# Patient Record
Sex: Male | Born: 1955
Health system: Southern US, Community
[De-identification: ages and names within clinical notes are randomized; demographics above are authoritative.]

## PROBLEM LIST (undated history)

## (undated) DIAGNOSIS — K635 Polyp of colon: Secondary | ICD-10-CM

## (undated) DIAGNOSIS — N200 Calculus of kidney: Secondary | ICD-10-CM

## (undated) DIAGNOSIS — M1990 Unspecified osteoarthritis, unspecified site: Secondary | ICD-10-CM

## (undated) HISTORY — PX: KIDNEY STONE SURGERY: SHX686

## (undated) HISTORY — PX: GALLBLADDER SURGERY: SHX652

## (undated) HISTORY — DX: Polyp of colon: K63.5

## (undated) HISTORY — PX: INGUINAL HERNIA REPAIR: SHX194

## (undated) HISTORY — DX: Unspecified osteoarthritis, unspecified site: M19.90

## (undated) HISTORY — DX: Calculus of kidney: N20.0

## (undated) HISTORY — PX: CATARACT EXTRACTION: SUR2

---

## 1998-01-18 ENCOUNTER — Ambulatory Visit (HOSPITAL_COMMUNITY): Admission: RE | Admit: 1998-01-18 | Discharge: 1998-01-18 | Payer: Self-pay | Admitting: Family Medicine

## 1998-01-18 ENCOUNTER — Encounter: Payer: Self-pay | Admitting: Family Medicine

## 1998-01-26 ENCOUNTER — Ambulatory Visit (HOSPITAL_COMMUNITY): Admission: RE | Admit: 1998-01-26 | Discharge: 1998-01-27 | Payer: Self-pay | Admitting: General Surgery

## 2004-07-21 ENCOUNTER — Ambulatory Visit (HOSPITAL_COMMUNITY): Admission: RE | Admit: 2004-07-21 | Discharge: 2004-07-21 | Payer: Self-pay | Admitting: General Surgery

## 2006-08-08 ENCOUNTER — Encounter: Admission: RE | Admit: 2006-08-08 | Discharge: 2006-08-08 | Payer: Self-pay | Admitting: General Surgery

## 2007-03-07 HISTORY — PX: COLONOSCOPY: SHX174

## 2008-06-08 ENCOUNTER — Emergency Department (HOSPITAL_COMMUNITY): Admission: EM | Admit: 2008-06-08 | Discharge: 2008-06-08 | Payer: Self-pay | Admitting: Emergency Medicine
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
## 2010-06-15 LAB — CBC
HCT: 42.1 % (ref 39.0–52.0)
Hemoglobin: 14.2 g/dL (ref 13.0–17.0)
MCHC: 33.7 g/dL (ref 30.0–36.0)
MCV: 90.4 fL (ref 78.0–100.0)
Platelets: 193 10*3/uL (ref 150–400)
RBC: 4.66 MIL/uL (ref 4.22–5.81)
RDW: 13.5 % (ref 11.5–15.5)
WBC: 8.4 10*3/uL (ref 4.0–10.5)

## 2010-06-15 LAB — DIFFERENTIAL
Basophils Absolute: 0 10*3/uL (ref 0.0–0.1)
Eosinophils Absolute: 0 10*3/uL (ref 0.0–0.7)
Lymphocytes Relative: 11 % — ABNORMAL LOW (ref 12–46)
Lymphs Abs: 0.9 10*3/uL (ref 0.7–4.0)
Neutrophils Relative %: 84 % — ABNORMAL HIGH (ref 43–77)

## 2010-06-15 LAB — POCT I-STAT, CHEM 8
Creatinine, Ser: 1.2 mg/dL (ref 0.4–1.5)
Hemoglobin: 15.3 g/dL (ref 13.0–17.0)
Potassium: 3.6 mEq/L (ref 3.5–5.1)
Sodium: 140 mEq/L (ref 135–145)

## 2010-07-22 NOTE — Op Note (Signed)
Raymond Rowe, Raymond Rowe               ACCOUNT NO.:  [id number]   MEDICAL RECORD NO.:  [id number]          PATIENT TYPE:  AMB   LOCATION:  DAY                          FACILITY:  [facility]   PHYSICIAN:  Ollen Gross. Vernell Morgans, M.D. DATE OF BIRTH:  Jul 22, 1955   DATE OF PROCEDURE:  07/21/2004  DATE OF DISCHARGE:                                 OPERATIVE REPORT   PREOPERATIVE DIAGNOSIS:  Right inguinal hernia.   POSTOPERATIVE DIAGNOSIS:  Right direct inguinal hernia.   PROCEDURE:  Right inguinal hernia repair with mesh.   SURGEON:  Dr. Carolynne Edouard   ANESTHESIA:  General endotracheal.   PROCEDURE:  After informed consent was obtained, the patient was brought to  the operating and placed in a supine position on the operating room table.  After induction of general anesthesia, the patient's abdomen and right groin  were prepped with Betadine and draped in usual sterile manner.  The right  groin was then infiltrated with 0.25% Marcaine.  A small incision was made  from the edge of the pubic tubercle on the right towards the anterior-  superior iliac spine for a distance of about 5 cm.  This incision was  carried down through the skin and subcutaneous tissue sharply with the  electrocautery.  A small bridging vein was encountered which was clamped  with hemostats, divided, and ligated with 3-0 silk ties.  The dissection was  carried down through the rest of subcutaneous tissue sharply with the  electrocautery until the fascia of the external oblique was encountered.  A  Weitlaner retractor was used to open the wound.  The fascia of the external  oblique was opened along its fibers towards the apex of the external ring  sharply with the 15 blade knife and Metzenbaum scissors.  Once this was  accomplished, the cord was dissected bluntly at the edge of the pubic  tubercle until the cord structures could be surrounded between two fingers.  A 1/2-inch Penrose drain was then placed around the cord structures  for  retraction purposes.  The cord structures themselves were skeletonized by a  combination of blunt hemostat dissection and sharp dissection with the  electrocautery.  There was no hernia sac within the cord structures medial  to the cord structures.  There was a definite weakened area of the floor of  the inguinal canal with a bulge that was broad-based.  This hernia was  easily reducible.  With the hernia reduced, the floor of the inguinal canal  was then reapproximated with interrupted 0 Vicryl stitches.  Next, a piece  of precut Duvall soft mesh was used.  Tails were cut in the mesh laterally.  The mesh was sewed inferiorly to the shelving edge of the inguinal ligament  using a running 2-0 Prolene stitch.  The tails were wrapped around the cord  structures laterally.  The superior portion of the mesh was sewed in with  interrupted 2-0 Prolene vertical mattress stitches to the muscular  aponeurotic strength layer of the transversalis.  The tails were anchored  laterally to the shelving edge of the inguinal ligament with  an interrupted  2-0 Prolene stitch.  Once this was accomplished, the mesh was in good  position without any tension.  The wound was irrigated with copious amounts  of saline.  The fascia of the external oblique was then reapproximated with  a running 2-0 Vicryl stitch.  The wound was infiltrated with more 0.25%  Marcaine.  The subcutaneous fascia was then closed with interrupted 3-0  Vicryl stitches, and the skin was closed with a running 4-0 Monocryl  subcuticular stitch.  Benzoin, Steri-Strips, and sterile dressings were  applied.  The patient tolerated the procedure well.  At the end of the case,  all needle, sponge, and instrument counts were correct.  The patient was  then awakened and taken to recovery room in stable condition.      PST/MEDQ  D:  07/21/2004  T:  07/21/2004  Job:  [id number]

## 2017-11-26 DIAGNOSIS — N200 Calculus of kidney: Secondary | ICD-10-CM | POA: Diagnosis not present

## 2017-11-26 DIAGNOSIS — N401 Enlarged prostate with lower urinary tract symptoms: Secondary | ICD-10-CM | POA: Diagnosis not present

## 2019-05-07 ENCOUNTER — Encounter: Payer: Self-pay | Admitting: Gastroenterology

## 2019-06-04 ENCOUNTER — Other Ambulatory Visit: Payer: Self-pay

## 2019-06-04 DIAGNOSIS — E349 Endocrine disorder, unspecified: Secondary | ICD-10-CM

## 2019-06-04 DIAGNOSIS — R7303 Prediabetes: Secondary | ICD-10-CM

## 2019-06-04 DIAGNOSIS — K219 Gastro-esophageal reflux disease without esophagitis: Secondary | ICD-10-CM

## 2019-06-04 DIAGNOSIS — R972 Elevated prostate specific antigen [PSA]: Secondary | ICD-10-CM

## 2019-06-04 DIAGNOSIS — E782 Mixed hyperlipidemia: Secondary | ICD-10-CM

## 2019-06-04 HISTORY — DX: Elevated prostate specific antigen (PSA): R97.20

## 2019-06-04 HISTORY — DX: Mixed hyperlipidemia: E78.2

## 2019-06-04 HISTORY — DX: Prediabetes: R73.03

## 2019-06-04 HISTORY — DX: Endocrine disorder, unspecified: E34.9

## 2019-06-04 HISTORY — DX: Gastro-esophageal reflux disease without esophagitis: K21.9

## 2019-06-10 ENCOUNTER — Encounter (INDEPENDENT_AMBULATORY_CARE_PROVIDER_SITE_OTHER): Payer: Self-pay

## 2019-06-10 ENCOUNTER — Ambulatory Visit (INDEPENDENT_AMBULATORY_CARE_PROVIDER_SITE_OTHER): Payer: Medicare Other | Admitting: Gastroenterology

## 2019-06-10 ENCOUNTER — Encounter: Payer: Self-pay | Admitting: Gastroenterology

## 2019-06-10 DIAGNOSIS — R195 Other fecal abnormalities: Secondary | ICD-10-CM | POA: Diagnosis not present

## 2019-06-10 DIAGNOSIS — Z01818 Encounter for other preprocedural examination: Secondary | ICD-10-CM | POA: Diagnosis not present

## 2019-06-10 DIAGNOSIS — K219 Gastro-esophageal reflux disease without esophagitis: Secondary | ICD-10-CM

## 2019-06-10 MED ORDER — SUPREP BOWEL PREP KIT 17.5-3.13-1.6 GM/177ML PO SOLN: 1.0000 | ORAL | 0 refills | Status: DC

## 2019-06-10 NOTE — Patient Instructions (Addendum)
If you are age 65 or older, your body mass index should be between 23-30. Your Body mass index is 28.35 kg/m. If this is out of the aforementioned range listed, please consider follow up with your Primary Care Provider.  If you are age 52 or younger, your body mass index should be between 19-25. Your Body mass index is 28.35 kg/m. If this is out of the aformentioned range listed, please consider follow up with your Primary Care Provider.   You have been scheduled for a colonoscopy. Please follow written instructions given to you at your visit today.  Please pick up your prep supplies at the pharmacy within the next 1-3 days. If you use inhalers (even only as needed), please bring them with you on the day of your procedure.  Please purchase the following medications over the counter and take as directed:  It is recommended that you take Pepcid (famotidine) 20mg  one tablet as needed for GERD symptoms.  You should avoid caffeine after 12-noon each day.  Thank you for entrusting me with your care and choosing [facility].  Dr Ardis Hughs

## 2019-06-10 NOTE — Progress Notes (Signed)
HPI: This is a very pleasant 64 year old man who was referred to me by Marco Collie, MD  to evaluate Hemoccult positive stool, chronic GERD.    He tells me that he did home colon cancer screening with fecal occult testing for 5 months ago and it was negative.  He underwent a physical exam by his primary care physician more recently and in office stool was positive for microscopic blood.  He admits he was very troubled by some constipation shortly before then.  Constipation is not really much of an issue for him in general.  After that severe episode of constipation he added some fruits to his diet and MiraLAX at first and that has all seem to help.  He knows he has a hiatal hernia.  He has been on omeprazole 40 mg which he takes about an hour prior to his breakfast meal every day.  On that regimen his chronic GERD symptoms are mainly improved.  He was also having some dysphagia before the omeprazole which has completely resolved.  He is however bothered by every 2 to 3 months waterbrash and choking on acid regurgitation.  He does not smoke cigarettes, drink alcohol, eat very late meals but he does have caffeinated sodas often in the p.m. and late hours.  His weight is overall stable   Review of systems: Pertinent positive and negative review of systems were noted in the above HPI section. All other review negative.   Past Medical History:  Diagnosis Date  . Arthritis   . Colon polyp   . Elevated PSA 06/04/2019  . Esophageal reflux 06/04/2019  . Kidney stones   . Mixed hyperlipidemia 06/04/2019  . Prediabetes 06/04/2019  . Testosterone deficiency 06/04/2019    Past Surgical History:  Procedure Laterality Date  . CATARACT EXTRACTION Right   . COLONOSCOPY  2009  . GALLBLADDER SURGERY    . INGUINAL HERNIA REPAIR Right   . KIDNEY STONE SURGERY     multiple surgeries    Current Outpatient Medications  Medication Sig Dispense Refill  . aspirin 81 MG EC tablet Take by mouth.    Marland Kitchen  atorvastatin (LIPITOR) 80 MG tablet Take by mouth.    . B Complex Vitamins (VITAMIN B COMPLEX) TABS Take by mouth.    . meloxicam (MOBIC) 15 MG tablet Take by mouth.    Marland Kitchen omeprazole (PRILOSEC) 40 MG capsule Take by mouth.    . tadalafil (CIALIS) 20 MG tablet     . tamsulosin (FLOMAX) 0.4 MG CAPS capsule Take 0.4 mg by mouth daily after supper.     No current facility-administered medications for this visit.    Allergies as of 06/10/2019  . (No Known Allergies)    Family History  Problem Relation Age of Onset  . Stomach cancer Father        dx at age 87  . Colon cancer Neg Hx     Social History   Socioeconomic History  . Marital status: Married    Spouse name: Not on file  . Number of children: 1  . Years of education: Not on file  . Highest education level: Not on file  Occupational History  . Occupation: Chief Executive Officer  Tobacco Use  . Smoking status: Former Smoker    Years: 10.00    Types: Cigarettes    Quit date: 1981    Years since quitting: 40.2  . Smokeless tobacco: Never Used  Substance and Sexual Activity  . Alcohol use: Never  . Drug  use: Never  . Sexual activity: Not on file  Other Topics Concern  . Not on file  Social History Narrative  . Not on file   Social Determinants of Health   Financial Resource Strain:   . Difficulty of Paying Living Expenses:   Food Insecurity:   . Worried About Charity fundraiser in the Last Year:   . Arboriculturist in the Last Year:   Transportation Needs:   . Film/video editor (Medical):   Marland Kitchen Lack of Transportation (Non-Medical):   Physical Activity:   . Days of Exercise per Week:   . Minutes of Exercise per Session:   Stress:   . Feeling of Stress :   Social Connections:   . Frequency of Communication with Friends and Family:   . Frequency of Social Gatherings with Friends and Family:   . Attends Religious Services:   . Active Member of Clubs or Organizations:   . Attends Archivist  Meetings:   Marland Kitchen Marital Status:   Intimate Partner Violence:   . Fear of Current or Ex-Partner:   . Emotionally Abused:   Marland Kitchen Physically Abused:   . Sexually Abused:      Physical Exam: BP 130/70   Pulse 88   Temp 98.5 F (36.9 C)   Ht 5\' 7"  (1.702 m)   Wt 181 lb (82.1 kg)   BMI 28.35 kg/m  Constitutional: generally well-appearing Psychiatric: alert and oriented x3 Eyes: extraocular movements intact Mouth: oral pharynx moist, no lesions Neck: supple no lymphadenopathy Cardiovascular: heart regular rate and rhythm Lungs: clear to auscultation bilaterally Abdomen: soft, nontender, nondistended, no obvious ascites, no peritoneal signs, normal bowel sounds Extremities: no lower extremity edema bilaterally Skin: no lesions on visible extremities   Assessment and plan: 64 y.o. male with Hemoccult positive stool, chronic GERD without alarm symptoms  I recommend a colonoscopy to evaluate his Hemoccult positive stool.  I explained that he should probably take the omeprazole a bit closer to his breakfast meal as that is the way it is designed to work most effectively.  I also recommended that he try to cut back on caffeinated beverages after noon or so as that can certainly be causing some of his waterbrash issues.  He has no alarm symptoms and I do not think he needs an upper endoscopy.   Please see the "Patient Instructions" section for addition details about the plan.   Owens Loffler, MD [facility] 06/10/2019, 3:00 PM  Cc: Marco Collie, MD  Total time on date of encounter was 45  minutes (this included time spent preparing to see the patient reviewing records; obtaining and/or reviewing separately obtained history; performing a medically appropriate exam and/or evaluation; counseling and educating the patient and family if present; ordering medications, tests or procedures if applicable; and documenting clinical information in the health record).

## 2019-07-16 ENCOUNTER — Ambulatory Visit (INDEPENDENT_AMBULATORY_CARE_PROVIDER_SITE_OTHER): Payer: Medicare Other

## 2019-07-16 ENCOUNTER — Other Ambulatory Visit: Payer: Self-pay

## 2019-07-16 ENCOUNTER — Other Ambulatory Visit: Payer: Self-pay | Admitting: Gastroenterology

## 2019-07-16 DIAGNOSIS — Z1159 Encounter for screening for other viral diseases: Secondary | ICD-10-CM

## 2019-07-17 ENCOUNTER — Telehealth: Payer: Self-pay | Admitting: Gastroenterology

## 2019-07-17 DIAGNOSIS — R195 Other fecal abnormalities: Secondary | ICD-10-CM

## 2019-07-17 LAB — SARS CORONAVIRUS 2 (TAT 6-24 HRS): SARS Coronavirus 2: NEGATIVE

## 2019-07-17 MED ORDER — SUPREP BOWEL PREP KIT 17.5-3.13-1.6 GM/177ML PO SOLN: 1.0000 | ORAL | 0 refills | Status: DC

## 2019-07-17 NOTE — Telephone Encounter (Signed)
Resent Suprep Rx into pharmacy- spoke with pt to make him aware of this.

## 2019-07-17 NOTE — Telephone Encounter (Signed)
Pt is scheduled for a colonoscopy tomorrow 07/18/19 and stated that pharmacy has not received prep script.  Please resend rx.

## 2019-07-18 ENCOUNTER — Encounter: Payer: Self-pay | Admitting: Gastroenterology

## 2019-07-18 ENCOUNTER — Other Ambulatory Visit: Payer: Self-pay

## 2019-07-18 ENCOUNTER — Ambulatory Visit (AMBULATORY_SURGERY_CENTER): Payer: Medicare Other | Admitting: Gastroenterology

## 2019-07-18 VITALS — BP 127/77 | HR 69 | Temp 95.9°F | Resp 11 | Ht 67.0 in | Wt 181.0 lb

## 2019-07-18 DIAGNOSIS — D123 Benign neoplasm of transverse colon: Secondary | ICD-10-CM

## 2019-07-18 DIAGNOSIS — D124 Benign neoplasm of descending colon: Secondary | ICD-10-CM | POA: Diagnosis not present

## 2019-07-18 DIAGNOSIS — R195 Other fecal abnormalities: Secondary | ICD-10-CM | POA: Diagnosis not present

## 2019-07-18 DIAGNOSIS — K5730 Diverticulosis of large intestine without perforation or abscess without bleeding: Secondary | ICD-10-CM

## 2019-07-18 DIAGNOSIS — D125 Benign neoplasm of sigmoid colon: Secondary | ICD-10-CM | POA: Diagnosis not present

## 2019-07-18 MED ORDER — SODIUM CHLORIDE 0.9 % IV SOLN: 500.0000 mL | Freq: Once | INTRAVENOUS | Status: DC

## 2019-07-18 NOTE — Op Note (Addendum)
[place] Patient Name: Raymond Rowe Procedure Date: 07/18/2019 7:03 AM MRN: [id number] Endoscopist: Milus Banister , MD Age: 64 Referring MD:  Date of Birth: [birth year] Gender: Male Account #: [id number] Procedure:                Colonoscopy Indications:              Heme positive stool Medicines:                Monitored Anesthesia Care Procedure:                Pre-Anesthesia Assessment:                           - Prior to the procedure, a History and Physical                            was performed, and patient medications and                            allergies were reviewed. The patient's tolerance of                            previous anesthesia was also reviewed. The risks                            and benefits of the procedure and the sedation                            options and risks were discussed with the patient.                            All questions were answered, and informed consent                            was obtained. Prior Anticoagulants: The patient has                            taken no previous anticoagulant or antiplatelet                            agents. ASA Grade Assessment: II - A patient with                            mild systemic disease. After reviewing the risks                            and benefits, the patient was deemed in                            satisfactory condition to undergo the procedure.                           After obtaining informed consent, the colonoscope  was passed under direct vision. Throughout the                            procedure, the patient's blood pressure, pulse, and                            oxygen saturations were monitored continuously. The                            Colonoscope was introduced through the anus and                            advanced to the the cecum, identified by                            appendiceal orifice and ileocecal valve. The                      colonoscopy was performed without difficulty. The                            patient tolerated the procedure well. The quality                            of the bowel preparation was good. The ileocecal                            valve, appendiceal orifice, and rectum were                            photographed. Scope In: 8:01:41 AM Scope Out: 8:15:04 AM Scope Withdrawal Time: 0 hours 10 minutes 29 seconds  Total Procedure Duration: 0 hours 13 minutes 23 seconds  Findings:                 Three sessile polyps were found in the sigmoid                            colon, descending colon and transverse colon. The                            polyps were 3 to 6 mm in size. These polyps were                            removed with a cold snare. Resection and retrieval                            were complete.                           Multiple small and large-mouthed diverticula were                            found in the left colon.  The exam was otherwise without abnormality on                            direct and retroflexion views. Complications:            No immediate complications. Estimated blood loss:                            None. Estimated Blood Loss:     Estimated blood loss: none. Impression:               - Three 3 to 6 mm polyps in the sigmoid colon, in                            the descending colon and in the transverse colon,                            removed with a cold snare. Resected and retrieved.                           - Diverticulosis in the left colon.                           - The examination was otherwise normal on direct                            and retroflexion views. Recommendation:           - Patient has a contact number available for                            emergencies. The signs and symptoms of potential                            delayed complications were discussed with the                             patient. Return to normal activities tomorrow.                            Written discharge instructions were provided to the                            patient.                           - Resume previous diet.                           - Continue present medications.                           - Await pathology results. Milus Banister, MD 07/18/2019 8:21:01 AM This report has been signed electronically.

## 2019-07-18 NOTE — Progress Notes (Signed)
To PACU, VSS. Report to [email or web address] 

## 2019-07-18 NOTE — Progress Notes (Signed)
Called to room to assist during endoscopic procedure.  Patient ID and intended procedure confirmed with present staff. Received instructions for my participation in the procedure from the performing physician.  

## 2019-07-18 NOTE — Patient Instructions (Signed)
Handouts given for polyps and diverticulosis.  Await pathology results.  YOU HAD AN ENDOSCOPIC PROCEDURE TODAY AT THE [facility]:   Refer to the procedure report that was given to you for any specific questions about what was found during the examination.  If the procedure report does not answer your questions, please call your gastroenterologist to clarify.  If you requested that your care partner not be given the details of your procedure findings, then the procedure report has been included in a sealed envelope for you to review at your convenience later.  YOU SHOULD EXPECT: Some feelings of bloating in the abdomen. Passage of more gas than usual.  Walking can help get rid of the air that was put into your GI tract during the procedure and reduce the bloating. If you had a lower endoscopy (such as a colonoscopy or flexible sigmoidoscopy) you may notice spotting of blood in your stool or on the toilet paper. If you underwent a bowel prep for your procedure, you may not have a normal bowel movement for a few days.  Please Note:  You might notice some irritation and congestion in your nose or some drainage.  This is from the oxygen used during your procedure.  There is no need for concern and it should clear up in a day or so.  SYMPTOMS TO REPORT IMMEDIATELY:   Following lower endoscopy (colonoscopy or flexible sigmoidoscopy):  Excessive amounts of blood in the stool  Significant tenderness or worsening of abdominal pains  Swelling of the abdomen that is new, acute  Fever of 100F or higher  For urgent or emergent issues, a gastroenterologist can be reached at any hour by calling [phone]. Do not use MyChart messaging for urgent concerns.    DIET:  We do recommend a small meal at first, but then you may proceed to your regular diet.  Drink plenty of fluids but you should avoid alcoholic beverages for 24 hours.  ACTIVITY:  You should plan to take it easy for the rest of  today and you should NOT DRIVE or use heavy machinery until tomorrow (because of the sedation medicines used during the test).    FOLLOW UP: Our staff will call the number listed on your records 48-72 hours following your procedure to check on you and address any questions or concerns that you may have regarding the information given to you following your procedure. If we do not reach you, we will leave a message.  We will attempt to reach you two times.  During this call, we will ask if you have developed any symptoms of COVID 19. If you develop any symptoms (ie: fever, flu-like symptoms, shortness of breath, cough etc.) before then, please call [phone].  If you test positive for Covid 19 in the 2 weeks post procedure, please call and report this information to us.    If any biopsies were taken you will be contacted by phone or by letter within the next 1-3 weeks.  Please call us at [phone] if you have not heard about the biopsies in 3 weeks.    SIGNATURES/CONFIDENTIALITY: You and/or your care partner have signed paperwork which will be entered into your electronic medical record.  These signatures attest to the fact that that the information above on your After Visit Summary has been reviewed and is understood.  Full responsibility of the confidentiality of this discharge information lies with you and/or your care-partner. 

## 2019-07-22 ENCOUNTER — Telehealth: Payer: Self-pay

## 2019-07-22 NOTE — Telephone Encounter (Signed)
  Follow up Call-  Call back number 07/18/2019  Post procedure Call Back phone  # [phone]  Permission to leave phone message Yes  Some recent data might be hidden     Patient questions:  Do you have a fever, pain , or abdominal swelling? No. Pain Score  0 *  Have you tolerated food without any problems? Yes.    Have you been able to return to your normal activities? Yes.    Do you have any questions about your discharge instructions: Diet   No. Medications  No. Follow up visit  No.  Do you have questions or concerns about your Care? No.  Actions: * If pain score is 4 or above: No action needed, pain <4.  1. Have you developed a fever since your procedure? no  2.   Have you had an respiratory symptoms (SOB or cough) since your procedure? no  3.   Have you tested positive for COVID 19 since your procedure no  4.   Have you had any family members/close contacts diagnosed with the COVID 19 since your procedure?  no   If yes to any of these questions please route to Joylene John, RN and Erenest Rasher, RN

## 2019-07-29 ENCOUNTER — Encounter: Payer: Self-pay | Admitting: Gastroenterology

## 2022-08-24 ENCOUNTER — Encounter: Payer: Self-pay | Admitting: Gastroenterology

## 2022-10-31 ENCOUNTER — Encounter: Payer: Self-pay | Admitting: Gastroenterology

## 2022-12-22 ENCOUNTER — Ambulatory Visit (AMBULATORY_SURGERY_CENTER): Payer: Medicare Other

## 2022-12-22 VITALS — Ht 67.0 in | Wt 179.8 lb

## 2022-12-22 DIAGNOSIS — Z860100 Personal history of colon polyps, unspecified: Secondary | ICD-10-CM

## 2022-12-22 MED ORDER — NA SULFATE-K SULFATE-MG SULF 17.5-3.13-1.6 GM/177ML PO SOLN: 1.0000 | Freq: Once | ORAL | 0 refills | Status: AC

## 2022-12-22 NOTE — Progress Notes (Signed)
No egg or soy allergy known to patient  No issues known to pt with past sedation with any surgeries or procedures Patient denies ever being told they had issues or difficulty with intubation  No FH of Malignant Hyperthermia Pt is not on diet pills Pt is not on  home 02  Pt is not on blood thinners  Pt denies issues with constipation  No A fib or A flutter Have any cardiac testing pending--no  LOA: independent  Prep: Suprep   Patient's chart reviewed by Cathlyn Parsons CNRA prior to previsit and patient appropriate for the [facility].  Previsit completed and red dot placed by patient's name on their procedure day (on provider's schedule).     PV competed with patient. Prep instructions sent via mychart and home address. Goodrx coupon for [facility] provided to use for price reduction if needed.

## 2023-01-17 ENCOUNTER — Ambulatory Visit: Payer: Medicare Other | Admitting: Gastroenterology

## 2023-01-17 ENCOUNTER — Encounter: Payer: Self-pay | Admitting: Gastroenterology

## 2023-01-17 VITALS — BP 116/71 | HR 81 | Temp 98.9°F | Resp 15 | Ht 67.0 in | Wt 180.0 lb

## 2023-01-17 DIAGNOSIS — D122 Benign neoplasm of ascending colon: Secondary | ICD-10-CM

## 2023-01-17 DIAGNOSIS — D123 Benign neoplasm of transverse colon: Secondary | ICD-10-CM

## 2023-01-17 DIAGNOSIS — Z860100 Personal history of colon polyps, unspecified: Secondary | ICD-10-CM

## 2023-01-17 DIAGNOSIS — Z09 Encounter for follow-up examination after completed treatment for conditions other than malignant neoplasm: Secondary | ICD-10-CM | POA: Diagnosis not present

## 2023-01-17 DIAGNOSIS — K6289 Other specified diseases of anus and rectum: Secondary | ICD-10-CM | POA: Diagnosis not present

## 2023-01-17 MED ORDER — SODIUM CHLORIDE 0.9 % IV SOLN: 500.0000 mL | Freq: Once | INTRAVENOUS | Status: DC

## 2023-01-17 NOTE — Op Note (Signed)
[facility] Patient Name: Raymond Rowe Procedure Date: 01/17/2023 7:52 AM MRN: [id number] Endoscopist: Corliss Parish , MD, [id number] Age: 67 Referring MD:  Date of Birth: [birth year] Gender: Male Account #: [id number] Procedure:                Colonoscopy Indications:              Surveillance: Personal history of adenomatous                            polyps on last colonoscopy 3 years ago Medicines:                Monitored Anesthesia Care Procedure:                Pre-Anesthesia Assessment:                           - Prior to the procedure, a History and Physical                            was performed, and patient medications and                            allergies were reviewed. The patient's tolerance of                            previous anesthesia was also reviewed. The risks                            and benefits of the procedure and the sedation                            options and risks were discussed with the patient.                            All questions were answered, and informed consent                            was obtained. Prior Anticoagulants: The patient has                            taken no anticoagulant or antiplatelet agents                            except for aspirin. ASA Grade Assessment: II - A                            patient with mild systemic disease. After reviewing                            the risks and benefits, the patient was deemed in                            satisfactory condition to undergo the procedure.  After obtaining informed consent, the colonoscope                            was passed under direct vision. Throughout the                            procedure, the patient's blood pressure, pulse, and                            oxygen saturations were monitored continuously. The                            Olympus Scope [id number] was introduced through the                             anus and advanced to the 3 cm into the ileum. The                            colonoscopy was performed without difficulty. The                            patient tolerated the procedure. The quality of the                            bowel preparation was good. The terminal ileum,                            ileocecal valve, appendiceal orifice, and rectum                            were photographed. Scope In: 7:57:06 AM Scope Out: 8:12:08 AM Scope Withdrawal Time: 0 hours 11 minutes 46 seconds  Total Procedure Duration: 0 hours 15 minutes 2 seconds  Findings:                 The digital rectal exam was normal. Pertinent                            negatives include no palpable rectal lesions.                           The terminal ileum and ileocecal valve appeared                            normal.                           Two sessile polyps were found in the transverse                            colon and ascending colon. The polyps were 3 to 4                            mm in size. These polyps were removed with a cold  snare. Resection and retrieval were complete.                           Multiple small-mouthed diverticula were found in                            the recto-sigmoid colon and sigmoid colon.                           Localized moderate inflammation characterized by                            granularity and shallow ulcerations was found in                            the mid rectum and in the distal rectum. Biopsies                            were taken with a cold forceps for histology.                           Normal mucosa was found in the entire colon                            otherwise.                           Non-bleeding non-thrombosed internal hemorrhoids                            were found during retroflexion. The hemorrhoids                            were Grade I (internal hemorrhoids that do not                             prolapse). Complications:            No immediate complications. Estimated Blood Loss:     Estimated blood loss was minimal. Impression:               - The examined portion of the ileum was normal.                           - Two 3 to 4 mm polyps in the transverse colon and                            in the ascending colon, removed with a cold snare.                            Resected and retrieved.                           - Diverticulosis in the recto-sigmoid colon and in  the sigmoid colon.                           - Localized moderate inflammation was found in the                            middle/distal rectum. Biopsied.                           - Normal mucosa in the entire examined colon                            otherwise.                           - Non-bleeding non-thrombosed internal hemorrhoids. Recommendation:           - The patient will be observed post-procedure,                            until all discharge criteria are met.                           - Discharge patient to home.                           - Patient has a contact number available for                            emergencies. The signs and symptoms of potential                            delayed complications were discussed with the                            patient. Return to normal activities tomorrow.                            Written discharge instructions were provided to the                            patient.                           - High fiber diet.                           - Use FiberCon 1-2 tablets PO daily.                           - Continue present medications.                           - Await pathology results.                           - If evidence of chronic proctitis will need to  consider additional workup/management.                           - Repeat colonoscopy in 5-7 years for surveillance                             based on pathology results.                           - The findings and recommendations were discussed                            with the patient.                           - The findings and recommendations were discussed                            with the patient's family. Corliss Parish, MD 01/17/2023 8:18:49 AM

## 2023-01-17 NOTE — Progress Notes (Signed)
Vss nad trans to pacu 

## 2023-01-17 NOTE — Patient Instructions (Signed)
Discharge instructions given. Handouts on polyps,Diverticulosis and Hemorrhoids. Resume previous medications. See report recommendations. YOU HAD AN ENDOSCOPIC PROCEDURE TODAY AT THE [facility]:   Refer to the procedure report that was given to you for any specific questions about what was found during the examination.  If the procedure report does not answer your questions, please call your gastroenterologist to clarify.  If you requested that your care partner not be given the details of your procedure findings, then the procedure report has been included in a sealed envelope for you to review at your convenience later.  YOU SHOULD EXPECT: Some feelings of bloating in the abdomen. Passage of more gas than usual.  Walking can help get rid of the air that was put into your GI tract during the procedure and reduce the bloating. If you had a lower endoscopy (such as a colonoscopy or flexible sigmoidoscopy) you may notice spotting of blood in your stool or on the toilet paper. If you underwent a bowel prep for your procedure, you may not have a normal bowel movement for a few days.  Please Note:  You might notice some irritation and congestion in your nose or some drainage.  This is from the oxygen used during your procedure.  There is no need for concern and it should clear up in a day or so.  SYMPTOMS TO REPORT IMMEDIATELY:  Following lower endoscopy (colonoscopy or flexible sigmoidoscopy):  Excessive amounts of blood in the stool  Significant tenderness or worsening of abdominal pains  Swelling of the abdomen that is new, acute  Fever of 100F or higher   For urgent or emergent issues, a gastroenterologist can be reached at any hour by calling [phone]. Do not use MyChart messaging for urgent concerns.    DIET:  We do recommend a small meal at first, but then you may proceed to your regular diet.  Drink plenty of fluids but you should avoid alcoholic beverages for 24  hours.  ACTIVITY:  You should plan to take it easy for the rest of today and you should NOT DRIVE or use heavy machinery until tomorrow (because of the sedation medicines used during the test).    FOLLOW UP: Our staff will call the number listed on your records the next business day following your procedure.  We will call around 7:15- 8:00 am to check on you and address any questions or concerns that you may have regarding the information given to you following your procedure. If we do not reach you, we will leave a message.     If any biopsies were taken you will be contacted by phone or by letter within the next 1-3 weeks.  Please call us at [phone] if you have not heard about the biopsies in 3 weeks.    SIGNATURES/CONFIDENTIALITY: You and/or your care partner have signed paperwork which will be entered into your electronic medical record.  These signatures attest to the fact that that the information above on your After Visit Summary has been reviewed and is understood.  Full responsibility of the confidentiality of this discharge information lies with you and/or your care-partner.

## 2023-01-17 NOTE — Progress Notes (Signed)
GASTROENTEROLOGY PROCEDURE H&P NOTE   Primary Care Physician: Abner Greenspan, MD  HPI: Raymond Rowe is a 67 y.o. male who presents for Colonoscopy for surveillance in setting of previous adenomas.   Past Medical History:  Diagnosis Date   Arthritis    Colon polyp    Elevated PSA 06/04/2019   Esophageal reflux 06/04/2019   Kidney stones    Mixed hyperlipidemia 06/04/2019   Prediabetes 06/04/2019   Testosterone deficiency 06/04/2019   Past Surgical History:  Procedure Laterality Date   CATARACT EXTRACTION Right    COLONOSCOPY  2009   GALLBLADDER SURGERY     INGUINAL HERNIA REPAIR Right    KIDNEY STONE SURGERY     multiple surgeries   Current Outpatient Medications  Medication Sig Dispense Refill   aspirin 81 MG EC tablet Take by mouth.     atorvastatin (LIPITOR) 80 MG tablet Take by mouth.     B Complex Vitamins (VITAMIN B COMPLEX) TABS Take by mouth.     CINNAMON PO Take by mouth daily.     fexofenadine (ALLEGRA) 180 MG tablet Take 180 mg by mouth daily.     Multiple Vitamin (MULTI-VITAMIN) tablet Take 1 tablet by mouth daily.     omeprazole (PRILOSEC) 40 MG capsule Take by mouth.     tamsulosin (FLOMAX) 0.4 MG CAPS capsule Take 0.4 mg by mouth daily after supper.     meloxicam (MOBIC) 15 MG tablet Take by mouth.     tadalafil (CIALIS) 20 MG tablet  (Patient not taking: Reported on 12/22/2022)     Current Facility-Administered Medications  Medication Dose Route Frequency Provider Last Rate Last Admin   0.9 %  sodium chloride infusion  500 mL Intravenous Once Mansouraty, Netty Starring., MD        Current Outpatient Medications:    aspirin 81 MG EC tablet, Take by mouth., Disp: , Rfl:    atorvastatin (LIPITOR) 80 MG tablet, Take by mouth., Disp: , Rfl:    B Complex Vitamins (VITAMIN B COMPLEX) TABS, Take by mouth., Disp: , Rfl:    CINNAMON PO, Take by mouth daily., Disp: , Rfl:    fexofenadine (ALLEGRA) 180 MG tablet, Take 180 mg by mouth daily., Disp: , Rfl:    Multiple  Vitamin (MULTI-VITAMIN) tablet, Take 1 tablet by mouth daily., Disp: , Rfl:    omeprazole (PRILOSEC) 40 MG capsule, Take by mouth., Disp: , Rfl:    tamsulosin (FLOMAX) 0.4 MG CAPS capsule, Take 0.4 mg by mouth daily after supper., Disp: , Rfl:    meloxicam (MOBIC) 15 MG tablet, Take by mouth., Disp: , Rfl:    tadalafil (CIALIS) 20 MG tablet, , Disp: , Rfl:   Current Facility-Administered Medications:    0.9 %  sodium chloride infusion, 500 mL, Intravenous, Once, Mansouraty, Netty Starring., MD No Known Allergies Family History  Problem Relation Age of Onset   Stomach cancer Father        dx at age 103   Colon cancer Neg Hx    Rectal cancer Neg Hx    Esophageal cancer Neg Hx    Colon polyps Neg Hx    Social History   Socioeconomic History   Marital status: Married    Spouse name: Not on file   Number of children: 1   Years of education: Not on file   Highest education level: Not on file  Occupational History   Occupation: industrial Passenger transport manager  Tobacco Use   Smoking status: Former  Current packs/day: 0.00    Types: Cigarettes    Start date: 28    Quit date: 62    Years since quitting: 43.8   Smokeless tobacco: Never  Vaping Use   Vaping status: Never Used  Substance and Sexual Activity   Alcohol use: Never   Drug use: Never   Sexual activity: Not on file  Other Topics Concern   Not on file  Social History Narrative   Not on file   Social Determinants of Health   Financial Resource Strain: Not on file  Food Insecurity: Not on file  Transportation Needs: Not on file  Physical Activity: Not on file  Stress: Not on file  Social Connections: Not on file  Intimate Partner Violence: Not on file    Physical Exam: Today's Vitals   01/17/23 0716  Pulse: 86  Temp: 98.9 F (37.2 C)  TempSrc: Skin  SpO2: 95%  Weight: 180 lb (81.6 kg)  Height: 5\' 7"  (1.702 m)   Body mass index is 28.19 kg/m. GEN: NAD EYE: Sclerae anicteric ENT: MMM CV:  Non-tachycardic GI: Soft, NT/ND NEURO:  Alert & Oriented x 3  Lab Results: No results for input(s): "WBC", "HGB", "HCT", "PLT" in the last 72 hours. BMET No results for input(s): "NA", "K", "CL", "CO2", "GLUCOSE", "BUN", "CREATININE", "CALCIUM" in the last 72 hours. LFT No results for input(s): "PROT", "ALBUMIN", "AST", "ALT", "ALKPHOS", "BILITOT", "BILIDIR", "IBILI" in the last 72 hours. PT/INR No results for input(s): "LABPROT", "INR" in the last 72 hours.   Impression / Plan: This is a 67 y.o.male who presents for Colonoscopy for surveillance in setting of previous adenomas.   The risks and benefits of endoscopic evaluation/treatment were discussed with the patient and/or family; these include but are not limited to the risk of perforation, infection, bleeding, missed lesions, lack of diagnosis, severe illness requiring hospitalization, as well as anesthesia and sedation related illnesses.  The patient's history has been reviewed, patient examined, no change in status, and deemed stable for procedure.  The patient and/or family is agreeable to proceed.    Raymond Parish, MD [facility]oscopy Office # [phone]

## 2023-01-17 NOTE — Progress Notes (Signed)
Pt's states no medical or surgical changes since previsit or office visit. VS assessed by C.W 

## 2023-01-17 NOTE — Progress Notes (Signed)
Called to room to assist during endoscopic procedure.  Patient ID and intended procedure confirmed with present staff. Received instructions for my participation in the procedure from the performing physician.  

## 2023-01-18 ENCOUNTER — Telehealth: Payer: Self-pay

## 2023-01-18 NOTE — Telephone Encounter (Signed)
  Follow up Call-     01/17/2023    7:16 AM  Call back number  Post procedure Call Back phone  # [phone]  Permission to leave phone message Yes     Patient questions:  Do you have a fever, pain , or abdominal swelling? No. Pain Score  0 *  Have you tolerated food without any problems? Yes.    Have you been able to return to your normal activities? Yes.    Do you have any questions about your discharge instructions: Diet   No. Medications  No. Follow up visit  No.  Do you have questions or concerns about your Care? No.  Actions: * If pain score is 4 or above: No action needed, pain <4.

## 2023-01-19 LAB — SURGICAL PATHOLOGY

## 2023-01-22 ENCOUNTER — Encounter: Payer: Self-pay | Admitting: Gastroenterology

## 2023-05-24 ENCOUNTER — Encounter: Payer: Self-pay | Admitting: Gastroenterology
